# Patient Record
Sex: Female | Born: 1945 | Race: White | Hispanic: No | Smoking: Never smoker
Health system: Southern US, Community
[De-identification: ages and names within clinical notes are randomized; demographics above are authoritative.]

## PROBLEM LIST (undated history)

## (undated) DIAGNOSIS — K219 Gastro-esophageal reflux disease without esophagitis: Secondary | ICD-10-CM

## (undated) DIAGNOSIS — I10 Essential (primary) hypertension: Secondary | ICD-10-CM

## (undated) DIAGNOSIS — R079 Chest pain, unspecified: Secondary | ICD-10-CM

## (undated) HISTORY — DX: Chest pain, unspecified: R07.9

## (undated) HISTORY — DX: Essential (primary) hypertension: I10

## (undated) HISTORY — PX: COLON SURGERY: SHX602

---

## 1998-04-05 ENCOUNTER — Other Ambulatory Visit: Admission: RE | Admit: 1998-04-05 | Discharge: 1998-04-05 | Payer: Self-pay | Admitting: Obstetrics and Gynecology

## 1998-07-26 ENCOUNTER — Other Ambulatory Visit: Admission: RE | Admit: 1998-07-26 | Discharge: 1998-07-26 | Payer: Self-pay | Admitting: Otolaryngology

## 1999-04-25 ENCOUNTER — Other Ambulatory Visit: Admission: RE | Admit: 1999-04-25 | Discharge: 1999-04-25 | Payer: Self-pay | Admitting: Obstetrics and Gynecology

## 2000-04-29 ENCOUNTER — Other Ambulatory Visit: Admission: RE | Admit: 2000-04-29 | Discharge: 2000-04-29 | Payer: Self-pay | Admitting: Obstetrics and Gynecology

## 2001-05-07 ENCOUNTER — Other Ambulatory Visit: Admission: RE | Admit: 2001-05-07 | Discharge: 2001-05-07 | Payer: Self-pay | Admitting: Obstetrics and Gynecology

## 2002-05-11 ENCOUNTER — Other Ambulatory Visit: Admission: RE | Admit: 2002-05-11 | Discharge: 2002-05-11 | Payer: Self-pay | Admitting: Obstetrics and Gynecology

## 2003-05-26 ENCOUNTER — Other Ambulatory Visit: Admission: RE | Admit: 2003-05-26 | Discharge: 2003-05-26 | Payer: Self-pay | Admitting: Obstetrics and Gynecology

## 2013-03-25 ENCOUNTER — Other Ambulatory Visit: Payer: Self-pay | Admitting: Family Medicine

## 2013-03-25 DIAGNOSIS — M25572 Pain in left ankle and joints of left foot: Secondary | ICD-10-CM

## 2013-03-26 ENCOUNTER — Ambulatory Visit
Admission: RE | Admit: 2013-03-26 | Discharge: 2013-03-26 | Disposition: A | Discharge status: Home / Self Care | Payer: Medicare Other | Source: Ambulatory Visit | Attending: Family Medicine | Admitting: Family Medicine

## 2013-03-26 DIAGNOSIS — M25572 Pain in left ankle and joints of left foot: Secondary | ICD-10-CM

## 2013-10-21 ENCOUNTER — Emergency Department (HOSPITAL_COMMUNITY): Payer: Medicare Other

## 2013-10-21 ENCOUNTER — Encounter (HOSPITAL_COMMUNITY): Payer: Self-pay | Admitting: Emergency Medicine

## 2013-10-21 ENCOUNTER — Emergency Department (HOSPITAL_COMMUNITY)
Admission: EM | Admit: 2013-10-21 | Discharge: 2013-10-22 | Disposition: A | Discharge status: Home / Self Care | Payer: Medicare Other | Attending: Emergency Medicine | Admitting: Emergency Medicine

## 2013-10-21 DIAGNOSIS — K219 Gastro-esophageal reflux disease without esophagitis: Secondary | ICD-10-CM | POA: Insufficient documentation

## 2013-10-21 DIAGNOSIS — Z79899 Other long term (current) drug therapy: Secondary | ICD-10-CM | POA: Insufficient documentation

## 2013-10-21 DIAGNOSIS — Z792 Long term (current) use of antibiotics: Secondary | ICD-10-CM | POA: Insufficient documentation

## 2013-10-21 DIAGNOSIS — R079 Chest pain, unspecified: Secondary | ICD-10-CM | POA: Insufficient documentation

## 2013-10-21 DIAGNOSIS — IMO0002 Reserved for concepts with insufficient information to code with codable children: Secondary | ICD-10-CM | POA: Insufficient documentation

## 2013-10-21 DIAGNOSIS — R112 Nausea with vomiting, unspecified: Secondary | ICD-10-CM | POA: Insufficient documentation

## 2013-10-21 HISTORY — DX: Gastro-esophageal reflux disease without esophagitis: K21.9

## 2013-10-21 LAB — BASIC METABOLIC PANEL
BUN: 22 mg/dL (ref 6–23)
CO2: 24 mEq/L (ref 19–32)
Calcium: 9.8 mg/dL (ref 8.4–10.5)
Chloride: 95 mEq/L — ABNORMAL LOW (ref 96–112)
Creatinine, Ser: 1.18 mg/dL — ABNORMAL HIGH (ref 0.50–1.10)
GFR, EST AFRICAN AMERICAN: 54 mL/min — AB (ref >90)
GFR, EST NON AFRICAN AMERICAN: 47 mL/min — AB (ref >90)
Glucose, Bld: 139 mg/dL — ABNORMAL HIGH (ref 70–99)
POTASSIUM: 3.9 meq/L (ref 3.7–5.3)
SODIUM: 135 meq/L — AB (ref 137–147)

## 2013-10-21 LAB — CBC
HCT: 36.3 % (ref 36.0–46.0)
Hemoglobin: 12.9 g/dL (ref 12.0–15.0)
MCH: 31.8 pg (ref 26.0–34.0)
MCHC: 35.5 g/dL (ref 30.0–36.0)
MCV: 89.4 fL (ref 78.0–100.0)
PLATELETS: 249 10*3/uL (ref 150–400)
RBC: 4.06 MIL/uL (ref 3.87–5.11)
RDW: 13.1 % (ref 11.5–15.5)
WBC: 10.6 10*3/uL — ABNORMAL HIGH (ref 4.0–10.5)

## 2013-10-21 LAB — I-STAT TROPONIN, ED: TROPONIN I, POC: 0.03 ng/mL (ref 0.00–0.08)

## 2013-10-21 LAB — TROPONIN I

## 2013-10-21 MED ORDER — GI COCKTAIL ~~LOC~~
30.0000 mL | Freq: Once | ORAL | Status: AC
  Administered 2013-10-21: 30 mL via ORAL
  Filled 2013-10-21: qty 30

## 2013-10-21 MED ORDER — FAMOTIDINE IN NACL 20-0.9 MG/50ML-% IV SOLN
20.0000 mg | Freq: Once | INTRAVENOUS | Status: AC
Start: 2013-10-21 — End: 2013-10-21
  Administered 2013-10-21: 20 mg via INTRAVENOUS
  Filled 2013-10-21: qty 50

## 2013-10-21 MED ORDER — SUCRALFATE 1 GM/10ML PO SUSP: 1.0000 g | Freq: Three times a day (TID) | ORAL | Status: DC

## 2013-10-21 NOTE — ED Notes (Signed)
Pt continues to c/o chest pressure, denies pain. EDP at bedside. Vital signs stable. No signs of distress noted.

## 2013-10-21 NOTE — Discharge Instructions (Signed)
As discussed, your evaluation today has been largely reassuring.  But, it is important that you monitor your condition carefully, and do not hesitate to return to the ED if you develop new, or concerning changes in your condition. ? ?Otherwise, please follow-up with your physician for appropriate ongoing care. ? ?

## 2013-10-21 NOTE — ED Notes (Signed)
Pt in c/o epigastric/ substernal chest pain since 11am today, states pain will ease off at times and then return, pain has increased throughout day, with with nausea and has vomiting x1, states she has a history of reflux and states this feels similar but that it has not been relieved with her normal medications

## 2013-10-21 NOTE — ED Provider Notes (Signed)
CSN: [id number]     Arrival date & time 10/21/13  1720 History   First MD Initiated Contact with Patient 10/21/13 2137     Chief Complaint  Patient presents with  . Chest Pain     (Consider location/radiation/quality/duration/timing/severity/associated sxs/prior Treatment) HPI Patient presents with concerns of chest pain.  Pain began approximately 8 hours prior to my initial evaluation.  Pain has been intermittent since onset.  The pain is focally about the epigastrium and inferior sternal area.  The pain is sore, burning. There was associated nausea with vomiting. The patient has a history of reflux, states that this is a similar. There is no other chest pain, dyspnea, no syncope, no extremity weakness or dysesthesia. No clear alleviating or exacerbating factors, including no relief with home medication.  Past Medical History  Diagnosis Date  . GERD (gastroesophageal reflux disease)    History reviewed. No pertinent past surgical history. History reviewed. No pertinent family history. History  Substance Use Topics  . Smoking status: Not on file  . Smokeless tobacco: Not on file  . Alcohol Use: Not on file   OB History   Grav Para Term Preterm Abortions TAB SAB Ect Mult Living                 Review of Systems  Constitutional:       Per HPI, otherwise negative  HENT:       Per HPI, otherwise negative  Respiratory:       Per HPI, otherwise negative  Cardiovascular:       Per HPI, otherwise negative  Gastrointestinal: Positive for nausea and vomiting.  Endocrine:       Negative aside from HPI  Genitourinary:       Neg aside from HPI   Musculoskeletal:       Per HPI, otherwise negative  Skin: Negative.   Neurological: Negative for syncope.      Allergies  Codeine  Home Medications   Current Outpatient Rx  Name  Route  Sig  Dispense  Refill  . albuterol (PROVENTIL HFA;VENTOLIN HFA) 108 (90 BASE) MCG/ACT inhaler   Inhalation   Inhale 2 puffs into the lungs  every 6 (six) hours as needed for wheezing or shortness of breath.         Marland Kitchen. amoxicillin-clavulanate (AUGMENTIN) 875-125 MG per tablet   Oral   Take 1 tablet by mouth 2 (two) times daily.         Marland Kitchen. atorvastatin (LIPITOR) 10 MG tablet   Oral   Take 10 mg by mouth daily.         . benzonatate (TESSALON) 100 MG capsule   Oral   Take 100 mg by mouth 3 (three) times daily as needed for cough.         . fluticasone (FLOVENT HFA) 110 MCG/ACT inhaler   Inhalation   Inhale 2 puffs into the lungs 2 (two) times daily.         Marland Kitchen. levothyroxine (SYNTHROID, LEVOTHROID) 150 MCG tablet   Oral   Take 150 mcg by mouth daily before breakfast.         . lisinopril-hydrochlorothiazide (PRINZIDE,ZESTORETIC) 20-25 MG per tablet   Oral   Take 1 tablet by mouth daily.         . montelukast (SINGULAIR) 10 MG tablet   Oral   Take 10 mg by mouth daily.         Marland Kitchen. omeprazole (PRILOSEC) 40 MG capsule   Oral  Take 40 mg by mouth daily.         . sucralfate (CARAFATE) 1 GM/10ML suspension   Oral   Take 10 mLs (1 g total) by mouth 4 (four) times daily -  with meals and at bedtime.   420 mL   0    BP 121/65  Pulse 88  Temp(Src) 98.3 F (36.8 C) (Oral)  Resp 18  SpO2 99% Physical Exam  Nursing note and vitals reviewed. Constitutional: She is oriented to person, place, and time. She appears well-developed and well-nourished. No distress.  HENT:  Head: Normocephalic and atraumatic.  Eyes: Conjunctivae and EOM are normal.  Cardiovascular: Normal rate and regular rhythm.   Pulmonary/Chest: Effort normal and breath sounds normal. No stridor. No respiratory distress.  Abdominal: She exhibits no distension. There is no tenderness.  Musculoskeletal: She exhibits no edema.  Neurological: She is alert and oriented to person, place, and time. No cranial nerve deficit.  Skin: Skin is warm and dry.  Psychiatric: She has a normal mood and affect.    ED Course  Procedures (including  critical care time) Labs Review Labs Reviewed  CBC - Abnormal; Notable for the following:    WBC 10.6 (*)    All other components within normal limits  BASIC METABOLIC PANEL - Abnormal; Notable for the following:    Sodium 135 (*)    Chloride 95 (*)    Glucose, Bld 139 (*)    Creatinine, Ser 1.18 (*)    GFR calc non Af Amer 47 (*)    GFR calc Af Amer 54 (*)    All other components within normal limits  TROPONIN I  Rosezena Sensor, ED   Imaging Review Dg Chest 2 View  10/21/2013   CLINICAL DATA:  Chest pain with nausea and disorientation. History of hypertension.  EXAM: CHEST  2 VIEW  COMPARISON:  None.  FINDINGS: The heart size and mediastinal contours are normal. The lungs are clear. There is no pleural effusion or pneumothorax. No acute osseous findings are identified.  IMPRESSION: No active cardiopulmonary process.   Electronically Signed   By: Roxy Horseman M.D.   On: 10/21/2013 19:09    EKG has sinus tachycardia, 110, low voltage, otherwise unremarkable   Update: On repeat exam the patient appears calm.  Discussed all findings, including serial negative troponins with patient and 2 daughters. Patient understands precautions, follow up instructions.  No ongoing pain  MDM   Final diagnoses:  Chest pain    Patient presents with almost 12 hours of pain by the end of her emergency department evaluation.  Patient has nonischemic EKG, serial negative troponins, largely reassuring evaluation.  Patient has minimal risk profile for ACS.  She is no response for PE.  The patient's history of reflux, there suspicion for gastroesophageal etiology given the reassuring findings.  Patient will follow up with primary care and cardiology, and was stable for discharge    Gerhard Munch, MD 10/21/13 2359

## 2013-11-11 ENCOUNTER — Encounter: Payer: Self-pay | Admitting: *Deleted

## 2013-11-11 ENCOUNTER — Encounter: Payer: Self-pay | Admitting: Cardiovascular Disease

## 2013-11-11 DIAGNOSIS — R079 Chest pain, unspecified: Secondary | ICD-10-CM | POA: Insufficient documentation

## 2013-11-11 DIAGNOSIS — K219 Gastro-esophageal reflux disease without esophagitis: Secondary | ICD-10-CM | POA: Insufficient documentation

## 2013-11-11 DIAGNOSIS — I10 Essential (primary) hypertension: Secondary | ICD-10-CM | POA: Insufficient documentation

## 2013-11-13 ENCOUNTER — Encounter: Payer: Self-pay | Admitting: Cardiovascular Disease

## 2013-11-13 ENCOUNTER — Ambulatory Visit (INDEPENDENT_AMBULATORY_CARE_PROVIDER_SITE_OTHER): Payer: Medicare Other | Admitting: Cardiovascular Disease

## 2013-11-13 VITALS — BP 130/82 | HR 88 | Ht 62.0 in | Wt 179.0 lb

## 2013-11-13 DIAGNOSIS — E785 Hyperlipidemia, unspecified: Secondary | ICD-10-CM

## 2013-11-13 DIAGNOSIS — K219 Gastro-esophageal reflux disease without esophagitis: Secondary | ICD-10-CM

## 2013-11-13 DIAGNOSIS — R079 Chest pain, unspecified: Secondary | ICD-10-CM

## 2013-11-13 DIAGNOSIS — I10 Essential (primary) hypertension: Secondary | ICD-10-CM

## 2013-11-13 NOTE — Assessment & Plan Note (Signed)
Cholesterol is at goal.  Continue current dose of statin and diet Rx.  No myalgias or side effects.  F/U  LFT's in 6 months. [facility] results [facility]  [facility] LDL was 149 Needs f/u in 3 months

## 2013-11-13 NOTE — Patient Instructions (Signed)

## 2013-11-13 NOTE — Assessment & Plan Note (Signed)
Well controlled.  Continue current medications and low sodium Dash type diet.    

## 2013-11-13 NOTE — Assessment & Plan Note (Signed)
Take any antibiotics with food  Continue prilosec

## 2013-11-13 NOTE — Progress Notes (Signed)
Patient ID: Stephanie Barron, female   DOB: [birth year], 68 y.o.   MRN: [id number]   68 yo referred by ER Seen 3/18 for SSCP  R/O with normal labs and CXR  ECG low voltage but no ST changes.  She had had a sinus infection and was on amoxacillin.  History of reflux  Continuous epigastric pain likely GI upset from antibiotics  Has been well since d/c still fighting allergies.  Recent Rx for elevated BP and cholesterol  Sedentary but does ADL's and looks after grand children.  No previous cardiac issues.  No recurrence of pain.  Pain was self limited to 3/18  Normally takes prilosec for reflux     ROS: Denies fever, malais, weight loss, blurry vision, decreased visual acuity, cough, sputum, SOB, hemoptysis, pleuritic pain, palpitaitons, heartburn, abdominal pain, melena, lower extremity edema, claudication, or rash.  All other systems reviewed and negative   General: Affect appropriate Healthy:  appears stated age HEENT: normal Neck supple with no adenopathy JVP normal no bruits no thyromegaly Lungs clear with no wheezing and good diaphragmatic motion Heart:  S1/S2 no murmur,rub, gallop or click PMI normal Abdomen: benighn, BS positve, no tenderness, no AAA no bruit.  No HSM or HJR Distal pulses intact with no bruits No edema Neuro non-focal Skin warm and dry No muscular weakness  Medications Current Outpatient Prescriptions  Medication Sig Dispense Refill  . albuterol (PROVENTIL HFA;VENTOLIN HFA) 108 (90 BASE) MCG/ACT inhaler Inhale 2 puffs into the lungs every 6 (six) hours as needed for wheezing or shortness of breath.      Marland Kitchen. atorvastatin (LIPITOR) 10 MG tablet Take 10 mg by mouth daily.      . fluticasone (FLOVENT HFA) 110 MCG/ACT inhaler Inhale 2 puffs into the lungs 2 (two) times daily.      Marland Kitchen. levothyroxine (SYNTHROID, LEVOTHROID) 150 MCG tablet Take 150 mcg by mouth daily before breakfast.      . lisinopril-hydrochlorothiazide (PRINZIDE,ZESTORETIC) 20-25 MG per tablet Take 1 tablet by  mouth daily.      . montelukast (SINGULAIR) 10 MG tablet Take 10 mg by mouth daily.      Marland Kitchen. omeprazole (PRILOSEC) 40 MG capsule Take 40 mg by mouth daily.       No current facility-administered medications for this visit.    Allergies Codeine  Family History: No family history on file.  Social History: History   Social History  . Marital Status: Widowed    Spouse Name: N/A    Number of Children: N/A  . Years of Education: N/A   Occupational History  . Not on file.   Social History Main Topics  . Smoking status: Never Smoker   . Smokeless tobacco: Not on file  . Alcohol Use: Not on file  . Drug Use: Not on file  . Sexual Activity: Not on file   Other Topics Concern  . Not on file   Social History Narrative  . No narrative on file    Electrocardiogram:  NSR low voltage otherwise normal   Assessment and Plan

## 2013-11-13 NOTE — Assessment & Plan Note (Signed)
Atypical likely related to reflux and acute Rx of sinus infection with antibiotics.  HTN and elevated lipids  Normal ECG F/U ETT

## 2013-12-22 ENCOUNTER — Ambulatory Visit (INDEPENDENT_AMBULATORY_CARE_PROVIDER_SITE_OTHER): Payer: Medicare Other | Admitting: Physician Assistant

## 2013-12-22 DIAGNOSIS — R079 Chest pain, unspecified: Secondary | ICD-10-CM

## 2013-12-22 NOTE — Progress Notes (Signed)
Exercise Treadmill Test  Pre-Exercise Testing Evaluation Rhythm: normal sinus  Rate: 78 bpm     Test  Exercise Tolerance Test Ordering MD: Charlton HawsPeter Nishan, MD  Interpreting MD: Tereso NewcomerScott Mariadelosang Wynns, PA-C  Unique Test No: 1  Treadmill:  1  Indication for ETT: chest pain - rule out ischemia  Contraindication to ETT: No   Stress Modality: exercise - treadmill  Cardiac Imaging Performed: non   Protocol: standard Bruce - maximal  Max BP:  226/96  Max MPHR (bpm):  154 85% MPR (bpm):  131  MPHR obtained (bpm):  157 % MPHR obtained:  101  Reached 85% MPHR (min:sec):  2:00 Total Exercise Time (min-sec):  5:00  Workload in METS:  7.0 Borg Scale: 15  Reason ETT Terminated:  desired heart rate attained    ST Segment Analysis At Rest: normal ST segments - no evidence of significant ST depression With Exercise: no evidence of significant ST depression  Other Information Arrhythmia:  No Angina during ETT:  absent (0) Quality of ETT:  diagnostic  ETT Interpretation:  normal - no evidence of ischemia by ST analysis  Comments: Fair exercise capacity. No chest pain. Exaggerated Hypertensive BP response to exercise. No ST changes to suggest ischemia.   Recommendations: F/u with Dr. Charlton HawsPeter Nishan as directed. Signed,  Tereso NewcomerScott Alanta Scobey, PA-C   12/22/2013 9:55 AM

## 2013-12-22 NOTE — Progress Notes (Signed)
Normal ETT 

## 2013-12-23 ENCOUNTER — Telehealth: Payer: Self-pay | Admitting: *Deleted

## 2013-12-23 NOTE — Telephone Encounter (Signed)
Message copied by Alois ClicheYORK, Shelda Truby E on Wed Dec 23, 2013 11:15 AM ------      Message from: Wendall StadeNISHAN, PETER C      Created: Tue Dec 22, 2013 12:01 PM                   ----- Message -----         From: Beatrice LecherScott T Weaver, PA-C         Sent: 12/22/2013   9:57 AM           To: Wendall StadePeter C Nishan, MD             ------

## 2013-12-23 NOTE — Telephone Encounter (Signed)
PT  AWARE  OF NORMAL  STRESS TEST .Zack Seal/CY

## 2014-07-29 ENCOUNTER — Other Ambulatory Visit: Payer: Self-pay | Admitting: Family Medicine

## 2014-07-29 DIAGNOSIS — M5416 Radiculopathy, lumbar region: Secondary | ICD-10-CM

## 2014-08-08 ENCOUNTER — Ambulatory Visit
Admission: RE | Admit: 2014-08-08 | Discharge: 2014-08-08 | Disposition: A | Discharge status: Home / Self Care | Payer: Medicare Other | Source: Ambulatory Visit | Attending: Family Medicine | Admitting: Family Medicine

## 2014-08-08 DIAGNOSIS — M5416 Radiculopathy, lumbar region: Secondary | ICD-10-CM

## 2015-09-30 ENCOUNTER — Other Ambulatory Visit: Payer: Self-pay | Admitting: Nephrology

## 2015-09-30 DIAGNOSIS — N183 Chronic kidney disease, stage 3 unspecified: Secondary | ICD-10-CM

## 2015-10-05 ENCOUNTER — Ambulatory Visit
Admission: RE | Admit: 2015-10-05 | Discharge: 2015-10-05 | Disposition: A | Discharge status: Home / Self Care | Payer: Medicare Other | Source: Ambulatory Visit | Attending: Nephrology | Admitting: Nephrology

## 2015-10-05 DIAGNOSIS — N183 Chronic kidney disease, stage 3 unspecified: Secondary | ICD-10-CM

## 2016-01-09 IMAGING — MR MR LUMBAR SPINE W/O CM
4 of 5 series · 26 of 48 positions shown · non-contrast
Comparison: Radiography 07/29/2014

CLINICAL DATA: Acute low back pain, 3 months duration, radiating to
the posterior left leg.

EXAM:
MRI LUMBAR SPINE WITHOUT CONTRAST
TECHNIQUE: Multiplanar, multisequence MR imaging of the lumbar spine was
performed. No intravenous contrast was administered.

[Series 3: T2 · sagittal · 4.0mm · 0.55mm/px · 6 of 12 slices shown (1 of 2)]
[im 1/12]
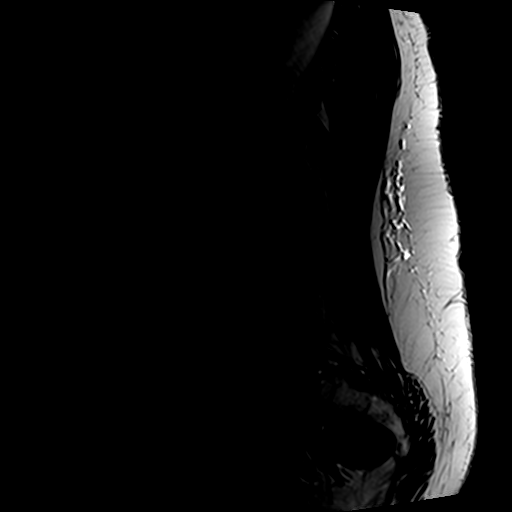
[im 3/12]
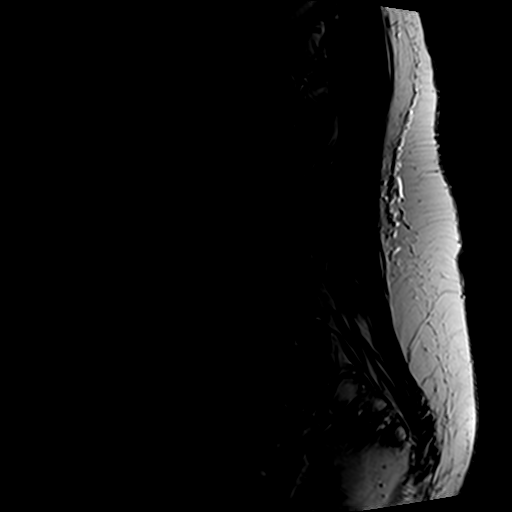
[im 5/12]
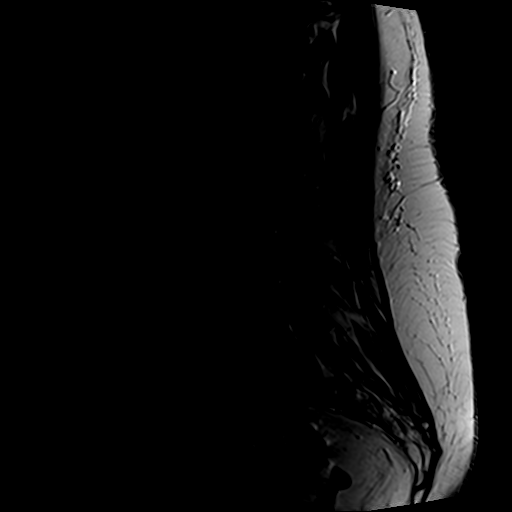
[im 7/12]
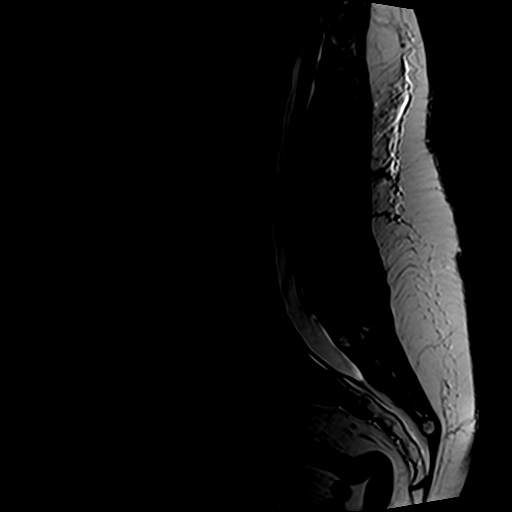
[im 9/12]
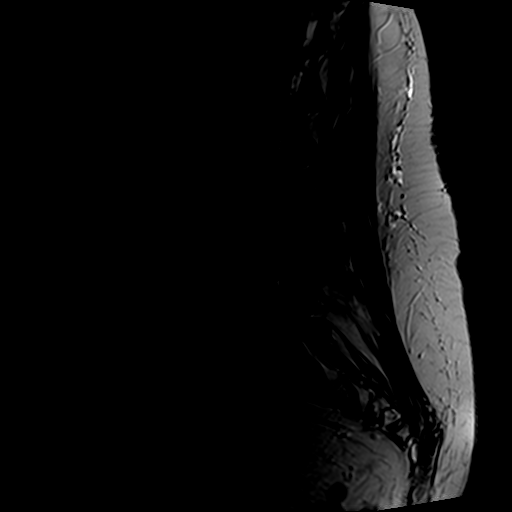
[im 12/12]
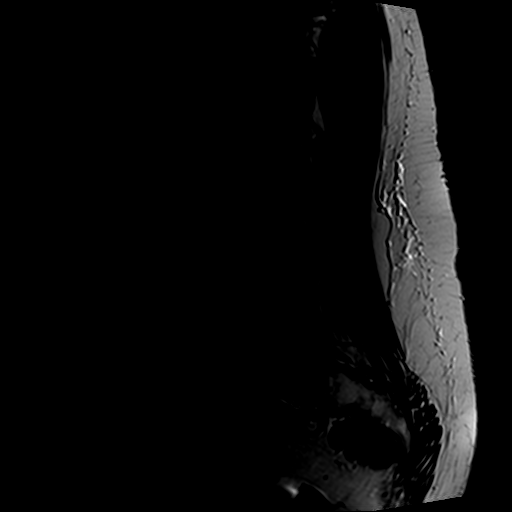

[Series 4: T1 · sagittal · 4.0mm · 0.55mm/px · 6 of 12 slices shown (1 of 2)]
[im 1/12]
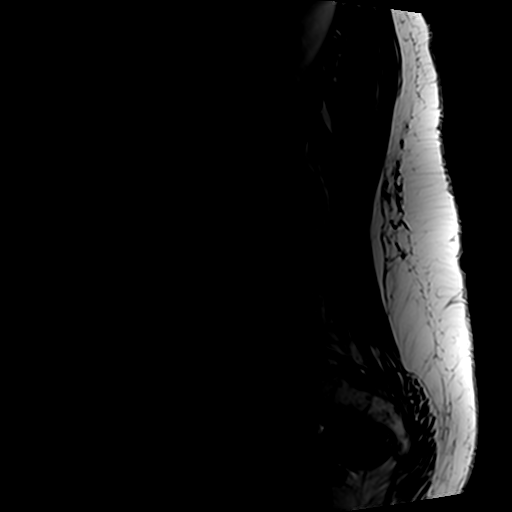
[im 3/12]
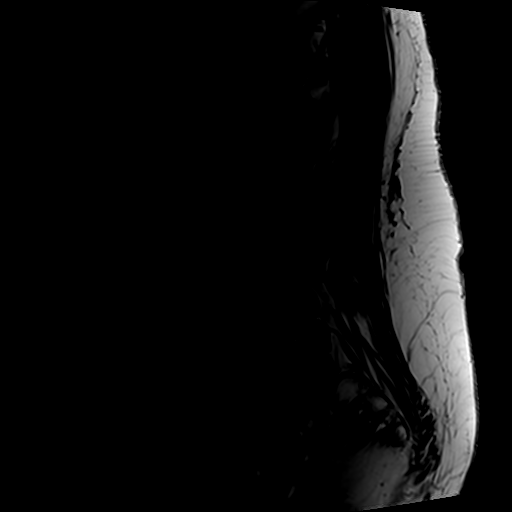
[im 5/12]
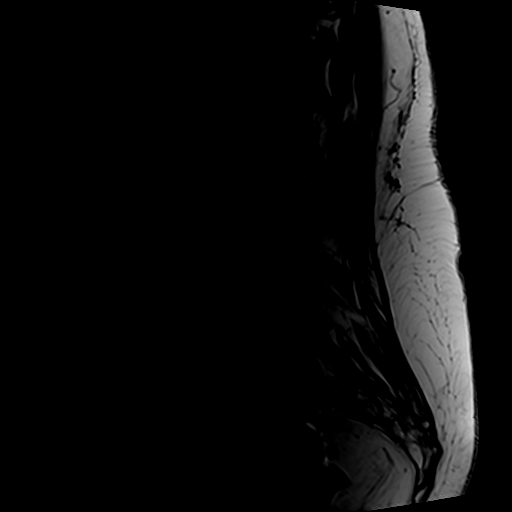
[im 7/12]
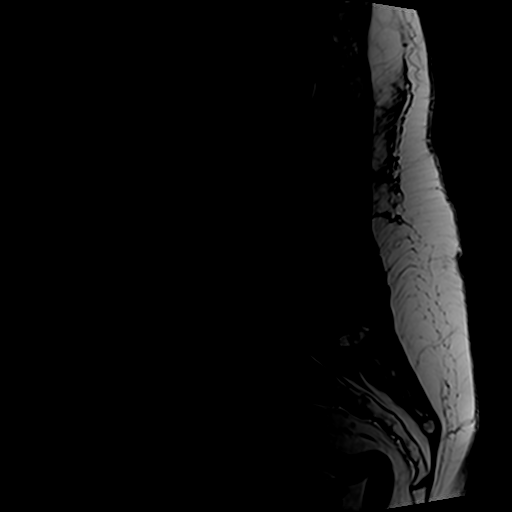
[im 9/12]
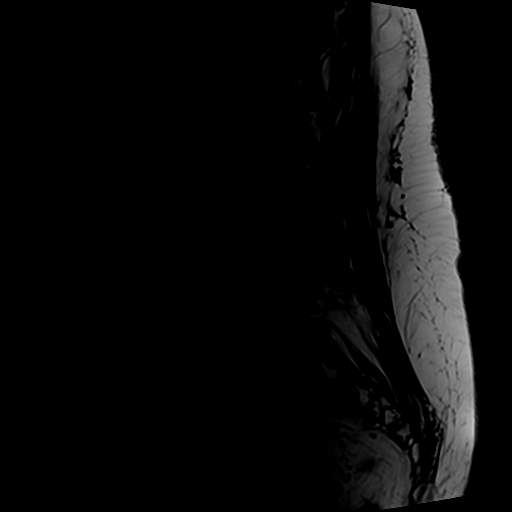
[im 12/12]
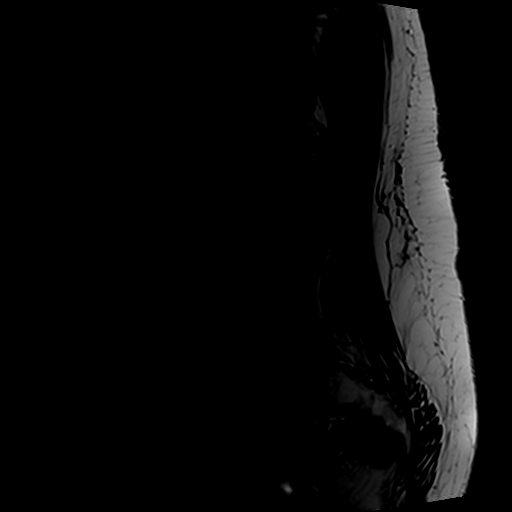

[Series 6: T2 · axial · 4.0mm · 0.70mm/px · z∈[-93,+79]mm · 9 of 31 slices shown (2 of 2)]
[im 1/31]
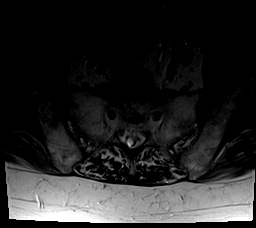
[im 5/31]
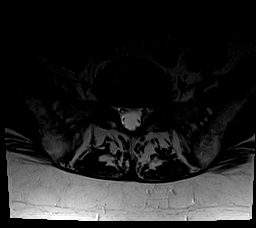
[im 9/31]
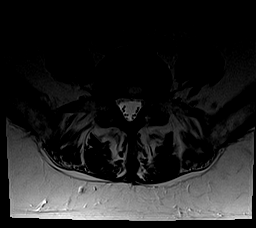
[im 13/31]
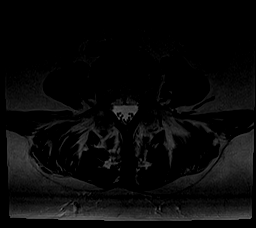
[im 16/31]
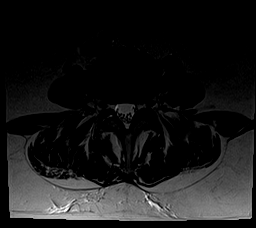
[im 18/31]
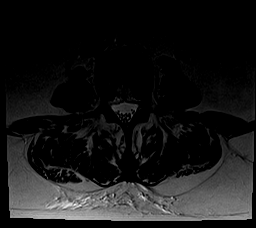
[im 22/31]
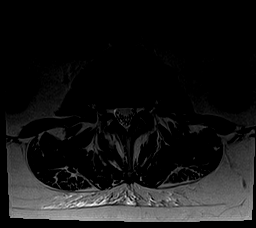
[im 26/31]
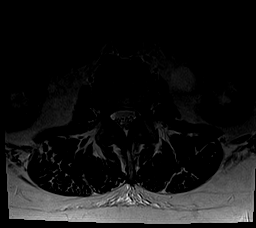
[im 31/31]
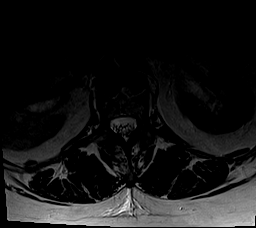

[Series 7: T1 · axial · 4.0mm · 0.35mm/px · z∈[-93,+54]mm · 5 of 31 slices shown (2 of 2)]
[im 1/31]
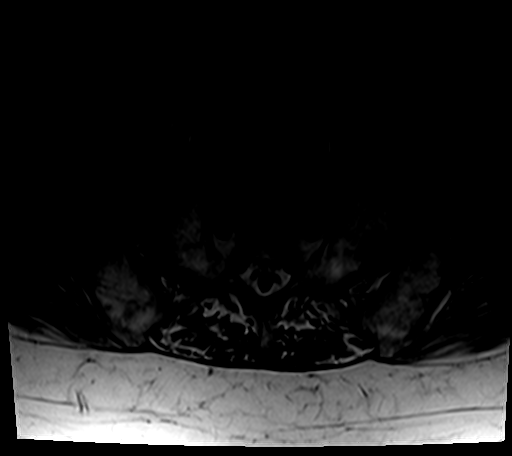
[im 5/31]
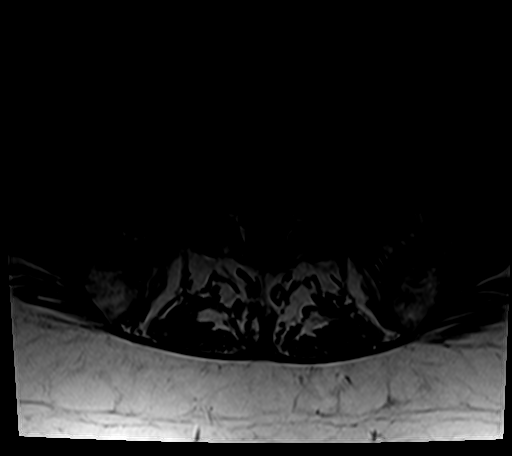
[im 9/31]
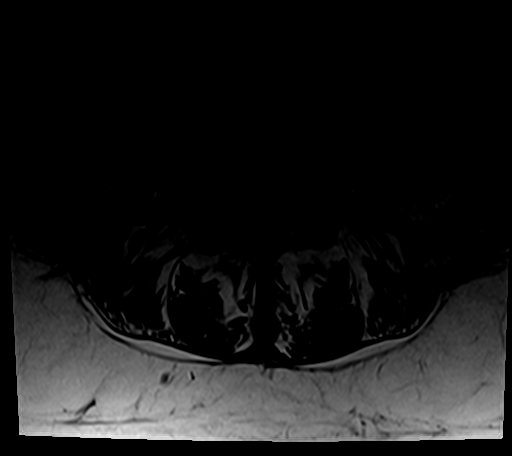
[im 16/31]
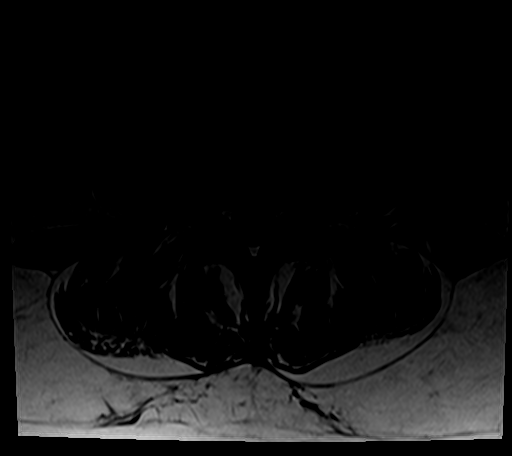
[im 26/31]
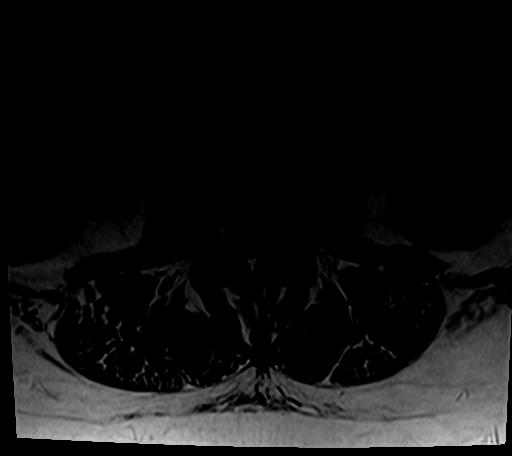

[26 of 48 positions shown; findings below may reference images not displayed]

FINDINGS: Alignment is spine is normal. The discs show desiccation with loss
of height moderate early at all levels. There is a minor disc bulges
at L2-3. At L5-S1, there is a small left paracentral protrusion with
slight upward migration. This does not appear to have any
significant effect upon the thecal sac or neural structures. There
is some discogenic edema at the endplates of L5-S1, more on the
right. This could be associated with back pain. No evidence of
edematous facet arthropathy. No slippage.
IMPRESSION: Degenerative disc disease at L5-S1 with discogenic edema of the
endplates, particularly on the right, which could be associated with
back pain. Very small disc protrusion with upward migration to the
left of midline at L5-S1, which does not appear to have any
significant effect upon the thecal sac or neural structures.

## 2017-03-07 IMAGING — US US RENAL
1 series · 14 of 25 positions shown · non-contrast
Comparison: None.

CLINICAL DATA: Chronic renal failure

EXAM:
RENAL / URINARY TRACT ULTRASOUND COMPLETE

[Series 1: us renal · 0.28mm/px · 14 of 46 slices shown]
[im 1/46]
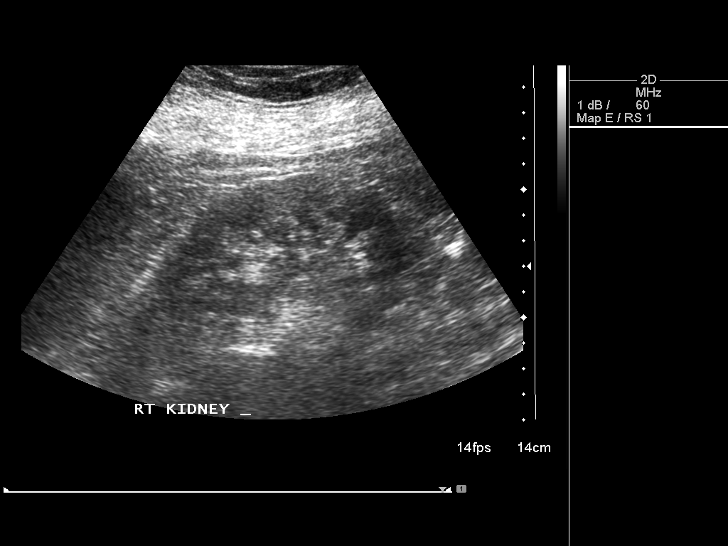
[im 4/46]
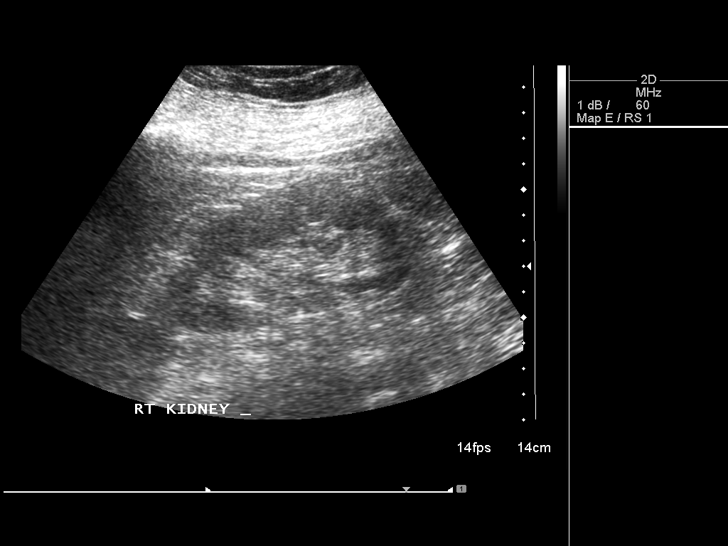
[im 8/46]
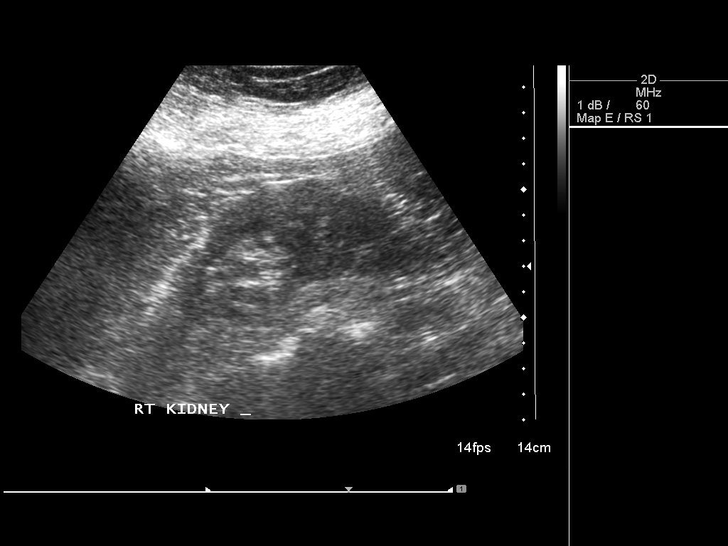
[im 12/46]
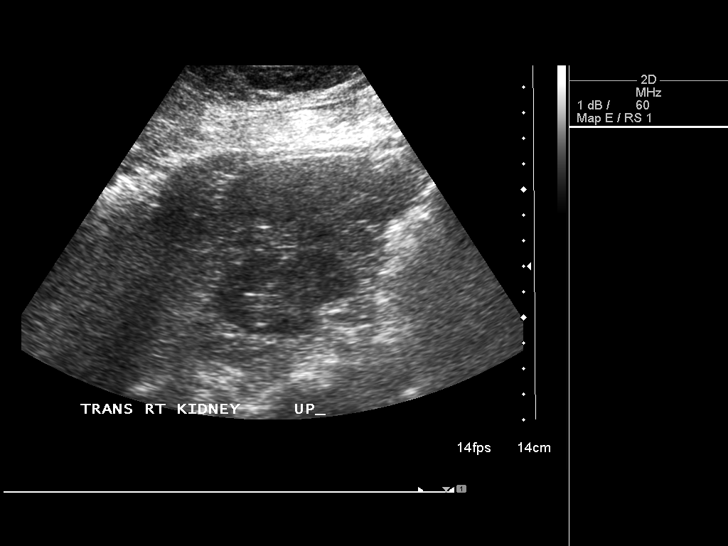
[im 16/46]
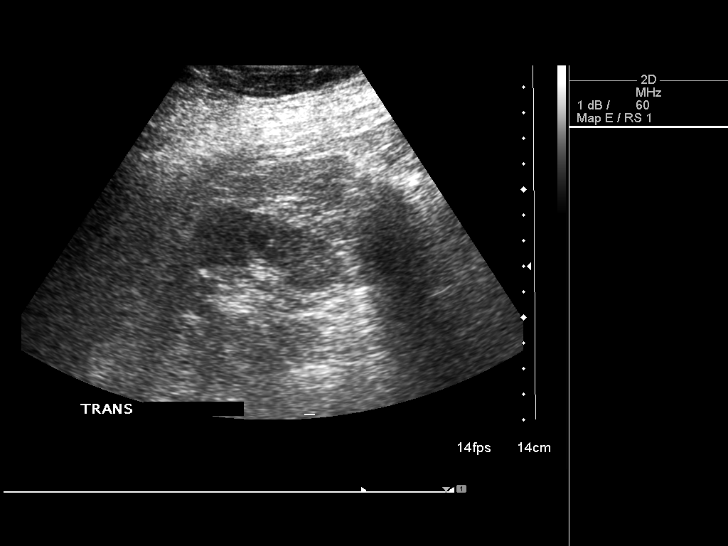
[im 17/46]
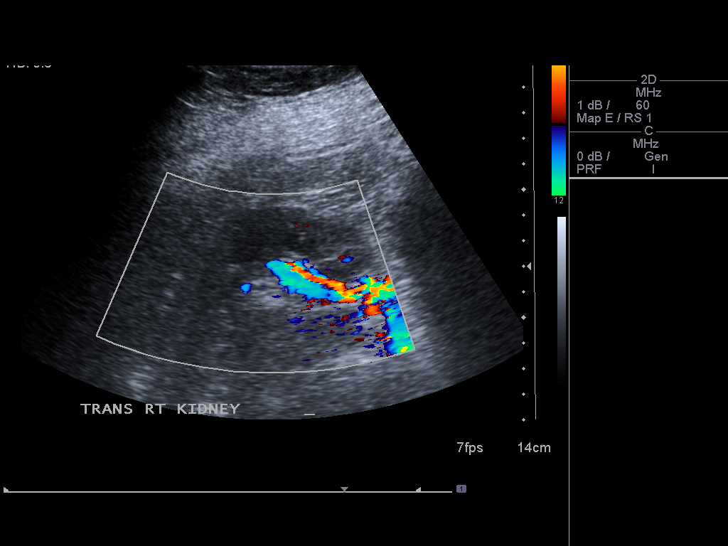
[im 21/46]
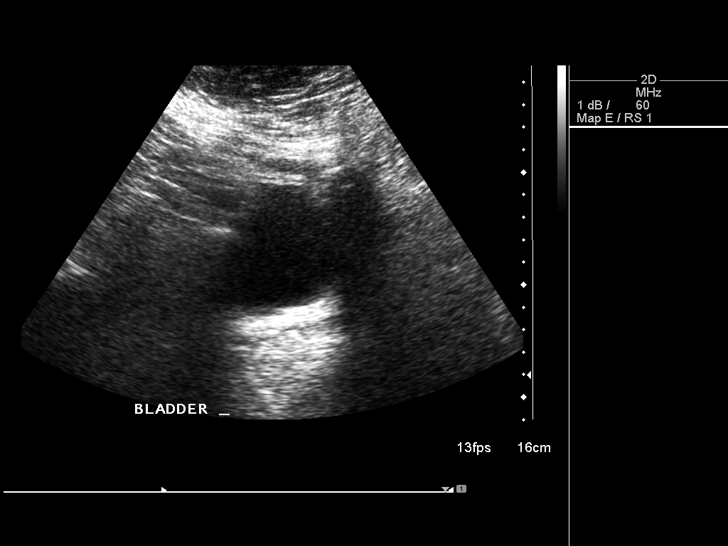
[im 25/46]
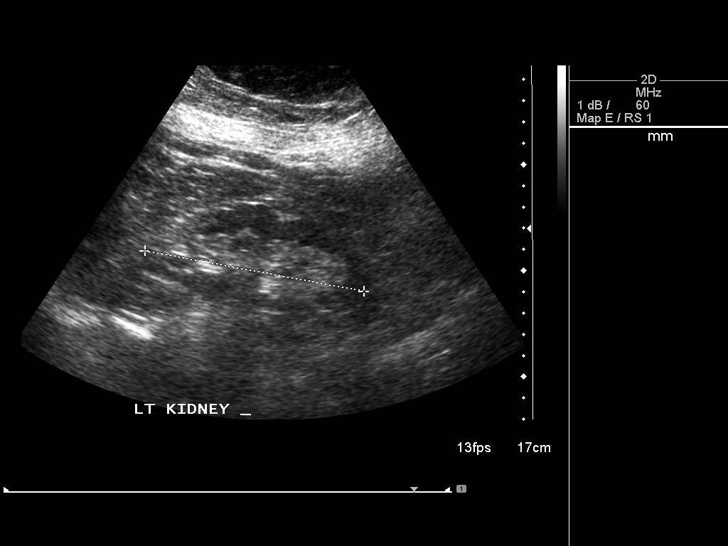
[im 29/46]
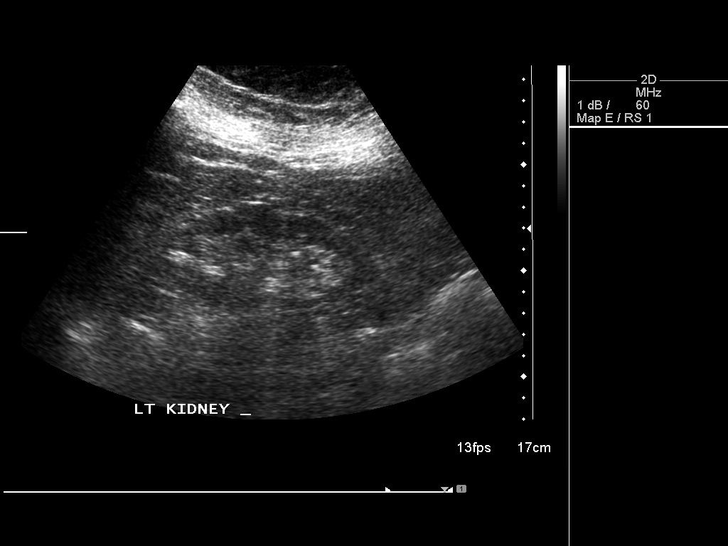
[im 31/46]
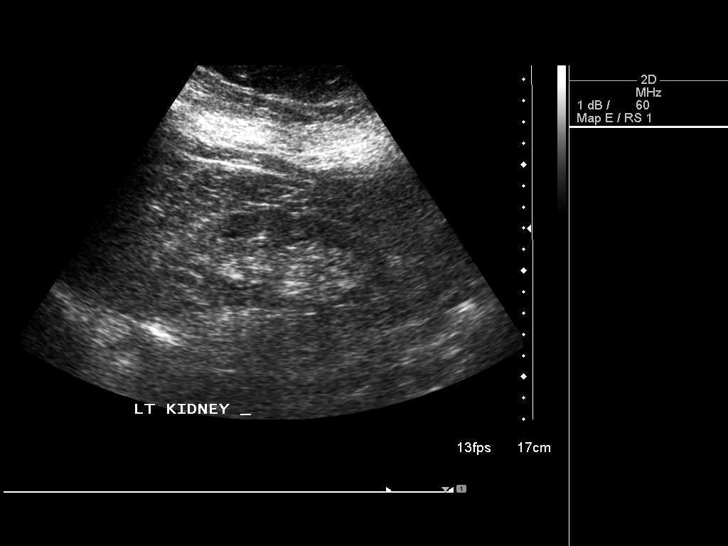
[im 34/46]
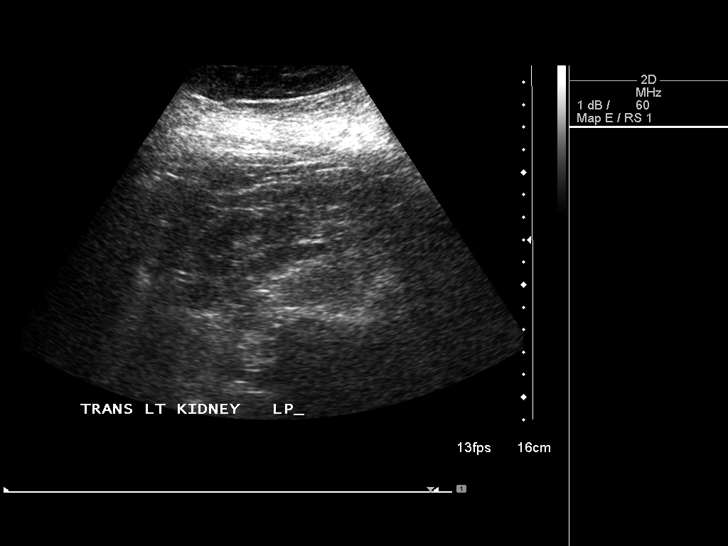
[im 38/46]
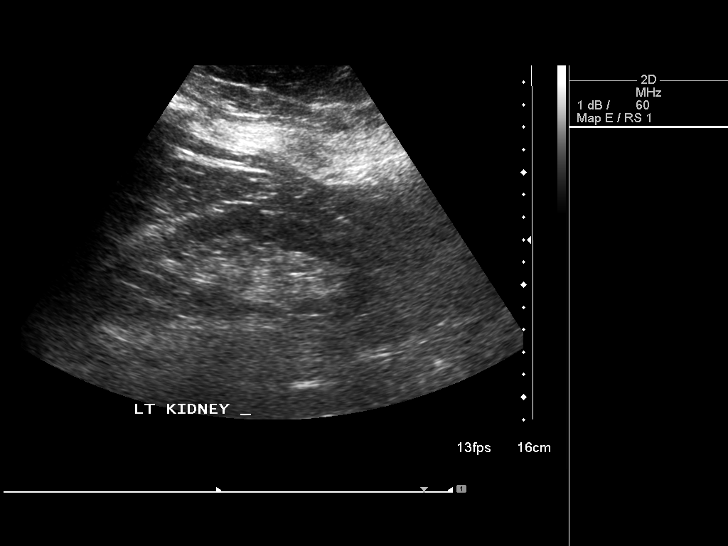
[im 42/46]
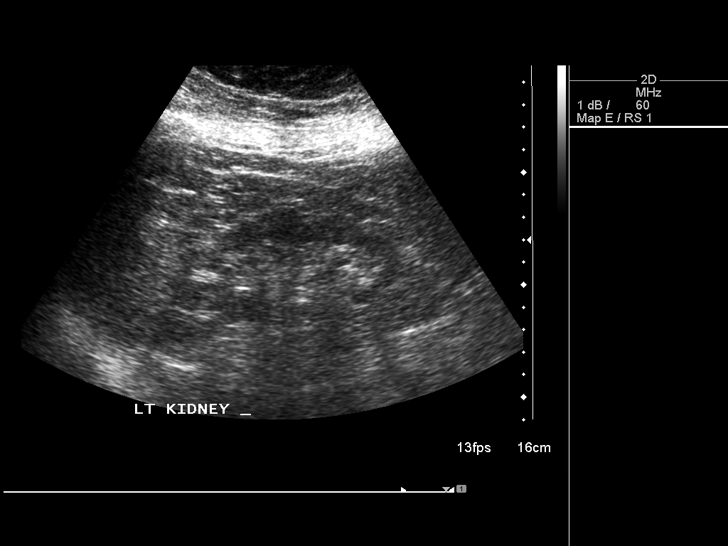
[im 46/46]
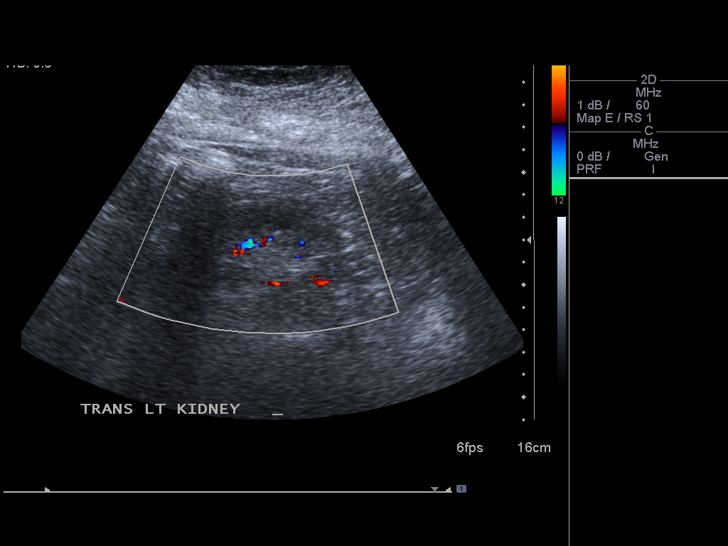

[14 of 25 positions shown; findings below may reference images not displayed]

FINDINGS: Right Kidney:

Length: 10.5 cm.. Echogenicity within normal limits. No mass or
hydronephrosis visualized.

Left Kidney:

Length: 10.5 cm..  Mild cortical thinning is noted.

Bladder:

Partially decompressed
IMPRESSION: Mild left cortical thinning.  No other focal abnormality is noted.

## 2019-08-28 ENCOUNTER — Ambulatory Visit: Payer: Medicare Other | Attending: Internal Medicine

## 2019-08-28 DIAGNOSIS — Z23 Encounter for immunization: Secondary | ICD-10-CM

## 2019-08-28 NOTE — Progress Notes (Signed)
   Covid-19 Vaccination Clinic  Name:  Stephanie Barron    MRN: [id number] DOB: 1945/09/28  08/28/2019  [name] was observed post Covid-19 immunization for 15 minutes without incidence. She was provided with Vaccine Information Sheet and instruction to access the V-Safe system.   [name] was instructed to call 911 with any severe reactions post vaccine: Marland Kitchen Difficulty breathing  . Swelling of your face and throat  . A fast heartbeat  . A bad rash all over your body  . Dizziness and weakness    Immunizations Administered    Name Date Dose VIS Date Route   Pfizer COVID-19 Vaccine 08/28/2019  8:55 AM 0.3 mL 07/17/2019 Intramuscular   Manufacturer: [employer], [employer]   Lot: [id number]   NDC: [address]-9

## 2019-09-18 ENCOUNTER — Ambulatory Visit: Payer: Medicare Other | Attending: Internal Medicine

## 2019-09-18 DIAGNOSIS — Z23 Encounter for immunization: Secondary | ICD-10-CM | POA: Insufficient documentation

## 2019-09-18 NOTE — Progress Notes (Signed)
   Covid-19 Vaccination Clinic  Name:  Stephanie Barron    MRN: [id number] DOB: August 23, 1945  09/18/2019  [name] was observed post Covid-19 immunization for 15 minutes without incidence. She was provided with Vaccine Information Sheet and instruction to access the V-Safe system.   [name] was instructed to call 911 with any severe reactions post vaccine: Marland Kitchen Difficulty breathing  . Swelling of your face and throat  . A fast heartbeat  . A bad rash all over your body  . Dizziness and weakness    Immunizations Administered    Name Date Dose VIS Date Route   Pfizer COVID-19 Vaccine 09/18/2019  9:32 AM 0.3 mL 07/17/2019 Intramuscular   Manufacturer: [employer], [employer]   Lot: [id number]   NDC: [address]-0

## 2022-05-08 ENCOUNTER — Encounter (HOSPITAL_BASED_OUTPATIENT_CLINIC_OR_DEPARTMENT_OTHER): Payer: Self-pay

## 2022-05-08 ENCOUNTER — Emergency Department (HOSPITAL_BASED_OUTPATIENT_CLINIC_OR_DEPARTMENT_OTHER): Payer: Medicare Other | Admitting: Radiology

## 2022-05-08 ENCOUNTER — Other Ambulatory Visit: Payer: Self-pay

## 2022-05-08 ENCOUNTER — Emergency Department (HOSPITAL_BASED_OUTPATIENT_CLINIC_OR_DEPARTMENT_OTHER)
Admission: EM | Admit: 2022-05-08 | Discharge: 2022-05-08 | Disposition: A | Discharge status: Home / Self Care | Payer: Medicare Other | Attending: Emergency Medicine | Admitting: Emergency Medicine

## 2022-05-08 DIAGNOSIS — S82832A Other fracture of upper and lower end of left fibula, initial encounter for closed fracture: Secondary | ICD-10-CM | POA: Insufficient documentation

## 2022-05-08 DIAGNOSIS — S51012A Laceration without foreign body of left elbow, initial encounter: Secondary | ICD-10-CM | POA: Insufficient documentation

## 2022-05-08 DIAGNOSIS — W010XXA Fall on same level from slipping, tripping and stumbling without subsequent striking against object, initial encounter: Secondary | ICD-10-CM | POA: Diagnosis not present

## 2022-05-08 DIAGNOSIS — S99912A Unspecified injury of left ankle, initial encounter: Secondary | ICD-10-CM | POA: Diagnosis present

## 2022-05-08 MED ORDER — BACITRACIN ZINC 500 UNIT/GM EX OINT
TOPICAL_OINTMENT | Freq: Two times a day (BID) | CUTANEOUS | Status: DC
  Administered 2022-05-08: 1 via TOPICAL

## 2022-05-08 NOTE — ED Notes (Signed)
Pt refused ordered standard Gilford Rile

## 2022-05-08 NOTE — Discharge Instructions (Signed)
Return if any problems.

## 2022-05-08 NOTE — ED Triage Notes (Signed)
Pt states she was on a stepping stone and her ankle rolled, causing her to fall to the ground . Pt has 2cm lac on left elbow area , left ankle pain, left shin pain.  Did not hit head, no LOC, no blood thinners.

## 2022-05-08 NOTE — ED Notes (Signed)
Left arm lac cleaned and dressed in triage.

## 2022-05-08 NOTE — ED Notes (Signed)
Pt d/c home per with family member per MD order. Discharge summary reviewed, verbalize understanding. Off unit via WC- no s/s of acute distress noted at discharge.

## 2022-05-08 NOTE — ED Provider Notes (Signed)
[facility] EMERGENCY DEPT Provider Note   CSN: [id number] Arrival date & time: 05/08/22  1248     History  Chief Complaint  Patient presents with   Stephanie Barron is a 76 y.o. female.  Patient reports she stepped on a stone and twisted her ankle and fell.  Patient complains of a laceration to her left elbow and pain and swelling to her left ankle patient reports this injury happened approximately 3 hours ago.  Patient has not been able to tolerate weightbearing.  Patient states she did not strike her head she did not lose consciousness  The history is provided by the patient. No language interpreter was used.  Fall This is a new problem. The problem occurs constantly. The problem has been gradually worsening. Pertinent negatives include no chest pain. Nothing aggravates the symptoms. Nothing relieves the symptoms. She has tried nothing for the symptoms. The treatment provided no relief.       Home Medications Prior to Admission medications   Medication Sig Start Date End Date Taking? Authorizing Provider  alendronate-cholecalciferol (FOSAMAX PLUS D) 70-2800 MG-UNIT tablet Take 1 tablet by mouth every 7 (seven) days. Take with a full glass of water on an empty stomach.   Yes [provider]  Cholecalciferol (VITAMIN D3) 50 MCG (2000 UT) capsule Take 2,000 Units by mouth daily.   Yes [provider]  sodium bicarbonate 650 MG tablet Take 650 mg by mouth 4 (four) times daily.   Yes [provider]  albuterol (PROVENTIL HFA;VENTOLIN HFA) 108 (90 BASE) MCG/ACT inhaler Inhale 2 puffs into the lungs every 6 (six) hours as needed for wheezing or shortness of breath.    [provider]  atorvastatin (LIPITOR) 10 MG tablet Take 10 mg by mouth daily.    [provider]  fluticasone (FLOVENT HFA) 110 MCG/ACT inhaler Inhale 2 puffs into the lungs 2 (two) times daily.    [provider]  levothyroxine (SYNTHROID,  LEVOTHROID) 150 MCG tablet Take 150 mcg by mouth daily before breakfast.    [provider]  lisinopril-hydrochlorothiazide (PRINZIDE,ZESTORETIC) 20-25 MG per tablet Take 1 tablet by mouth daily.    [provider]  montelukast (SINGULAIR) 10 MG tablet Take 10 mg by mouth daily.    [provider]  omeprazole (PRILOSEC) 40 MG capsule Take 40 mg by mouth daily.    [provider]      Allergies    Codeine    Review of Systems   Review of Systems  Cardiovascular:  Negative for chest pain.  Skin:  Positive for wound.  All other systems reviewed and are negative.   Physical Exam Updated Vital Signs BP 137/79 (BP Location: Right Arm)   Pulse 73   Temp 98.4 F (36.9 C) (Oral)   Resp 16   Ht 5\' 2"  (1.575 m)   Wt 73.5 kg   SpO2 100%   BMI 29.63 kg/m  Physical Exam Vitals and nursing note reviewed.  Constitutional:      Appearance: Normal appearance. She is well-developed.  HENT:     Head: Normocephalic.  Cardiovascular:     Rate and Rhythm: Normal rate.  Pulmonary:     Effort: Pulmonary effort is normal.  Abdominal:     General: There is no distension.  Musculoskeletal:        General: Normal range of motion.     Cervical back: Normal range of motion.     Comments:  Swollen tender  or lateral malleolus  left ankle, pain with moving neurovascular neurosensory are intact 1 cm superficial laceration left elbow, 8 mm superficial laceration left elbow full range of motion neurovascular neurosensory are intact  Skin:    General: Skin is warm.  Neurological:     General: No focal deficit present.     Mental Status: She is alert and oriented to person, place, and time.  Psychiatric:        Mood and Affect: Mood normal.     ED Results / Procedures / Treatments   Labs (all labs ordered are listed, but only abnormal results are displayed) Labs Reviewed - No data to display  EKG None  Radiology DG Ankle Complete Left  Result Date:  05/08/2022 CLINICAL DATA:  Fall. Rolled ankle off a curb. Pain and swelling at lateral ankle. EXAM: LEFT ANKLE COMPLETE - 3+ VIEW COMPARISON:  Report only from 03/25/2013 left ankle radiographs describing distal fibular acute nondisplaced fracture; MRI left ankle 03/26/2013 FINDINGS: There is transverse lucency within the distal fibula that appears to represent an acute fracture extending through the lateral cortex and contacting the medial cortex. Although an acute fracture was also seen on prior remote 03/26/2013 MRI, this appears new. Additional mild irregularity of the distal tip of the fibula with a possible small ossicle may represent the sequela of the more remote trauma. Mild-to-moderate lateral and anterior ankle soft tissue swelling. The ankle mortise is symmetric and intact. Minimal distal medial malleolar degenerative spurring. Minimal chronic enthesopathic change at the Achilles insertion on the calcaneus. IMPRESSION: 1. Acute nondisplaced fracture of the distal fibula. 2. Mild irregularity of the distal tip of the fibula with a possible small ossicle may represent the sequela of the known remote fracture seen acutely on 03/26/2013 prior MRI. Electronically Signed   By: Neita Garnet M.D.   On: 05/08/2022 13:28    Procedures Procedures    Medications Ordered in ED Medications  bacitracin ointment (has no administration in time range)    ED Course/ Medical Decision Making/ A&P                           Medical Decision Making Patient twisted her ankle and fell patient complains of swelling and pain to her left ankle and cuts to her left elbow  Amount and/or Complexity of Data Reviewed Independent Historian:     Details: Patient is here with her daughter who is supportive Radiology: ordered and independent interpretation performed. Decision-making details documented in ED Course.    Details: Acute nondisplaced fracture of distal fibula  Risk OTC drugs. Risk Details: Patient  counseled on fracture patient is placed in a cam walker she is advised to limit weightbearing she is to follow-up with orthopedist patient reports she has been seen by Delbert Harness  orthopedist in the past           Final Clinical Impression(s) / ED Diagnoses Final diagnoses:  Other closed fracture of distal end of left fibula, initial encounter  Laceration of left elbow, initial encounter    Rx / DC Orders ED Discharge Orders     None      An After Visit Summary was printed and given to the patient.    Elson Areas, [place] 05/08/22 1502    Terrilee Files, MD 05/08/22 [id number]
